# Patient Record
Sex: Male | Born: 1940
Health system: Southern US, Community
[De-identification: ages and names within clinical notes are randomized; demographics above are authoritative.]

## PROBLEM LIST (undated history)

## (undated) DIAGNOSIS — R5383 Other fatigue: Secondary | ICD-10-CM

## (undated) DIAGNOSIS — R630 Anorexia: Secondary | ICD-10-CM

## (undated) DIAGNOSIS — M79604 Pain in right leg: Secondary | ICD-10-CM

## (undated) DIAGNOSIS — M7989 Other specified soft tissue disorders: Secondary | ICD-10-CM

## (undated) HISTORY — DX: Other specified soft tissue disorders: M79.89

## (undated) HISTORY — DX: Other fatigue: R53.83

## (undated) HISTORY — DX: Anorexia: R63.0

## (undated) HISTORY — DX: Pain in right leg: M79.604

---

## 2016-01-19 ENCOUNTER — Ambulatory Visit: Payer: Self-pay | Attending: Internal Medicine

## 2016-03-20 ENCOUNTER — Ambulatory Visit (INDEPENDENT_AMBULATORY_CARE_PROVIDER_SITE_OTHER): Payer: No Typology Code available for payment source | Admitting: Family Medicine

## 2016-03-20 ENCOUNTER — Encounter: Payer: Self-pay | Admitting: Family Medicine

## 2016-03-20 VITALS — BP 129/73 | HR 66 | Temp 97.7°F | Resp 16 | Ht 63.0 in | Wt 152.0 lb

## 2016-03-20 DIAGNOSIS — Z7689 Persons encountering health services in other specified circumstances: Secondary | ICD-10-CM

## 2016-03-20 DIAGNOSIS — Z0000 Encounter for general adult medical examination without abnormal findings: Secondary | ICD-10-CM

## 2016-03-20 DIAGNOSIS — Z23 Encounter for immunization: Secondary | ICD-10-CM

## 2016-03-20 LAB — CBC WITH DIFFERENTIAL/PLATELET
BASOS PCT: 0 %
Basophils Absolute: 0 cells/uL (ref 0–200)
EOS PCT: 3 %
Eosinophils Absolute: 192 cells/uL (ref 15–500)
HCT: 49.2 % (ref 38.5–50.0)
Hemoglobin: 16.5 g/dL (ref 13.2–17.1)
LYMPHS PCT: 42 %
Lymphs Abs: 2688 cells/uL (ref 850–3900)
MCH: 29.9 pg (ref 27.0–33.0)
MCHC: 33.5 g/dL (ref 32.0–36.0)
MCV: 89.3 fL (ref 80.0–100.0)
MONOS PCT: 13 %
MPV: 12 fL (ref 7.5–12.5)
Monocytes Absolute: 832 cells/uL (ref 200–950)
NEUTROS ABS: 2688 {cells}/uL (ref 1500–7800)
Neutrophils Relative %: 42 %
PLATELETS: 170 10*3/uL (ref 140–400)
RBC: 5.51 MIL/uL (ref 4.20–5.80)
RDW: 14.7 % (ref 11.0–15.0)
WBC: 6.4 10*3/uL (ref 3.8–10.8)

## 2016-03-20 LAB — PSA: PSA: 1.7 ng/mL (ref <=4.0)

## 2016-03-20 LAB — HIV ANTIBODY (ROUTINE TESTING W REFLEX): HIV: NONREACTIVE

## 2016-03-20 NOTE — Patient Instructions (Signed)
Continue healthy diet and regular exercise Come back in 6 months Come back sooner if needed.

## 2016-03-20 NOTE — Progress Notes (Signed)
Leon Baird, is a 75 y.o. male  RUE:[id number]:[id number]  JYN:[id number]  DOB - Sep 26, 1940  CC:  Chief Complaint  Patient presents with  . Establish Care       HPI: Leon JulyKeshav Kilker is a 75 y.o. male here to establish care. He speaks little [place] and his daughter acts as Equities traderinterpreter. They reports that he has no chronic health problems and takes no regular medications.  He eats a health diet, high in vegetables, fruits and lean, non-fried meats. He walks 4-5 miles daily. He does not use tobacco, alcohol or drugs.   Health maintenance: He will accept a Tdap and flu shot today. Will also perfor HIV screening, Prostate and colon cancer screeing.   No Known Allergies History reviewed. No pertinent past medical history. No current outpatient prescriptions on file prior to visit.   No current facility-administered medications on file prior to visit.    History reviewed. No pertinent family history. Social History   Social History  . Marital status: Married    Spouse name: N/A  . Number of children: N/A  . Years of education: N/A   Occupational History  . Not on file.   Social History Main Topics  . Smoking status: Former Games developermoker  . Smokeless tobacco: Never Used  . Alcohol use No  . Drug use: No  . Sexual activity: Not on file   Other Topics Concern  . Not on file   Social History Narrative  . No narrative on file    Review of Systems: Constitutional: Negative Skin: Negative HENT: Negative. Positive for hearing aid Eyes: Negative  Neck: Negative Respiratory: Negative Cardiovascular: Negative Gastrointestinal: Negative Genitourinary: Negative  Musculoskeletal: Negative   Neurological: Negative  Hematological: Negative  Psychiatric/Behavioral: Negative    Objective:   Vitals:   03/20/16 0902  BP: 129/73  Pulse: 66  Resp: 16  Temp: 97.7 F (36.5 C)    Physical Exam: Constitutional: Patient appears well-developed and well-nourished. No distress. HENT:  Normocephalic, atraumatic, External right and left ear normal. Oropharynx is clear and moist.  Eyes: Conjunctivae and EOM are normal. PERRLA, no scleral icterus. Neck: Normal ROM. Neck supple. No lymphadenopathy, No thyromegaly. CVS: RRR, S1/S2 +, no murmurs, no gallops, no rubs Pulmonary: Effort and breath sounds normal, no stridor, rhonchi, wheezes, rales.  Abdominal: Soft. Normoactive BS,, no distension, tenderness, rebound or guarding.  Musculoskeletal: Normal range of motion. No edema and no tenderness.  Neuro: Alert.Normal muscle tone coordination. Non-focal Skin: Skin is warm and dry. No rash noted. Not diaphoretic. No erythema. No pallor. Psychiatric: Normal mood and affect. Behavior, judgment, thought content normal.  No results found for: WBC, HGB, HCT, MCV, PLT No results found for: CREATININE, BUN, NA, K, CL, CO2  No results found for: HGBA1C Lipid Panel  No results found for: CHOL, TRIG, HDL, CHOLHDL, VLDL, LDLCALC     Assessment and plan:   1. Healthcare maintenance  - Hemoglobin A1c - CBC with Differential - COMPLETE METABOLIC PANEL WITH GFR - Lipid panel - HIV antibody (with reflex) - PSA - POC Hemoccult Bld/Stl (3-Cd Home Screen); Future - Tdap vaccine greater than or equal to 7yo IM - Flu Vaccine QUAD 36+ mos PF IM (Fluarix & Fluzone Quad PF)  2. Encounter to establish care -I have reviewed information presented by the patient and his daughter.    Return in about 6 months (around 09/18/2016).  The patient was given clear instructions to go to ER or return to medical center if symptoms don't improve,  worsen or new problems develop. The patient verbalized understanding.    Henrietta Hoover FNP  03/20/2016, 11:06 AM

## 2016-03-21 ENCOUNTER — Other Ambulatory Visit: Payer: Self-pay | Admitting: Family Medicine

## 2016-03-21 LAB — COMPLETE METABOLIC PANEL WITH GFR
ALT: 19 U/L (ref 9–46)
AST: 23 U/L (ref 10–35)
Albumin: 4.4 g/dL (ref 3.6–5.1)
Alkaline Phosphatase: 44 U/L (ref 40–115)
BUN: 12 mg/dL (ref 7–25)
CHLORIDE: 99 mmol/L (ref 98–110)
CO2: 24 mmol/L (ref 20–31)
CREATININE: 1.31 mg/dL — AB (ref 0.70–1.18)
Calcium: 9.9 mg/dL (ref 8.6–10.3)
GFR, EST AFRICAN AMERICAN: 62 mL/min (ref >=60)
GFR, Est Non African American: 53 mL/min — ABNORMAL LOW (ref >=60)
GLUCOSE: 95 mg/dL (ref 65–99)
Potassium: 5.4 mmol/L — ABNORMAL HIGH (ref 3.5–5.3)
SODIUM: 136 mmol/L (ref 135–146)
Total Bilirubin: 1.4 mg/dL — ABNORMAL HIGH (ref 0.2–1.2)
Total Protein: 7.3 g/dL (ref 6.1–8.1)

## 2016-03-21 LAB — LIPID PANEL
Cholesterol: 247 mg/dL — ABNORMAL HIGH (ref 125–200)
HDL: 58 mg/dL (ref >=40)
LDL CALC: 130 mg/dL — AB (ref <130)
TRIGLYCERIDES: 295 mg/dL — AB (ref <150)
Total CHOL/HDL Ratio: 4.3 Ratio (ref <=5.0)
VLDL: 59 mg/dL — AB (ref <30)

## 2016-03-21 LAB — HEMOGLOBIN A1C
HEMOGLOBIN A1C: 5.5 % (ref <5.7)
MEAN PLASMA GLUCOSE: 111 mg/dL

## 2016-03-21 MED ORDER — SIMVASTATIN 20 MG PO TABS: 20.0000 mg | ORAL_TABLET | Freq: Every day | ORAL | 1 refills | Status: AC

## 2016-03-21 MED FILL — SIMVASTATIN 20 MG TABLET: 20 | 30 days supply | Qty: 30 | Fill #0

## 2016-03-22 ENCOUNTER — Other Ambulatory Visit: Payer: Self-pay | Admitting: Family Medicine

## 2016-09-25 ENCOUNTER — Ambulatory Visit: Payer: No Typology Code available for payment source | Admitting: Family Medicine

## 2016-11-06 ENCOUNTER — Ambulatory Visit: Payer: No Typology Code available for payment source | Admitting: Internal Medicine

## 2017-05-11 ENCOUNTER — Ambulatory Visit: Payer: Self-pay | Attending: Family Medicine

## 2017-05-18 ENCOUNTER — Other Ambulatory Visit: Payer: Self-pay | Admitting: Gastroenterology

## 2017-05-18 DIAGNOSIS — R1033 Periumbilical pain: Secondary | ICD-10-CM

## 2017-05-24 ENCOUNTER — Ambulatory Visit
Admission: RE | Admit: 2017-05-24 | Discharge: 2017-05-24 | Disposition: A | Discharge status: Home / Self Care | Payer: No Typology Code available for payment source | Source: Ambulatory Visit | Attending: Gastroenterology | Admitting: Gastroenterology

## 2017-05-24 DIAGNOSIS — R1033 Periumbilical pain: Secondary | ICD-10-CM

## 2017-05-24 MED ORDER — IOPAMIDOL (ISOVUE-300) INJECTION 61%
100.0000 mL | Freq: Once | INTRAVENOUS | Status: AC | PRN
  Administered 2017-05-24: 100 mL via INTRAVENOUS

## 2017-05-25 IMAGING — CT CT ABD-PELV W/ CM
1 of 3 series · 14 of 32 positions shown, 19 images · IV contrast (APPLIED)
Comparison: None.

CLINICAL DATA: Intermittent umbilical pain x3 weeks

EXAM:
CT ABDOMEN AND PELVIS WITH CONTRAST
TECHNIQUE: Multidetector CT imaging of the abdomen and pelvis was performed
using the standard protocol following bolus administration of
intravenous contrast.
CONTRAST:  100mL [8I] IOPAMIDOL ([8I]) INJECTION 61%

[Series 2: abd/pelvis w/cm · axial · 0.71mm/px · z∈[-411,+4]mm · 14 of 93 slices shown, 19 images]
[im 5/93  soft-tissue]
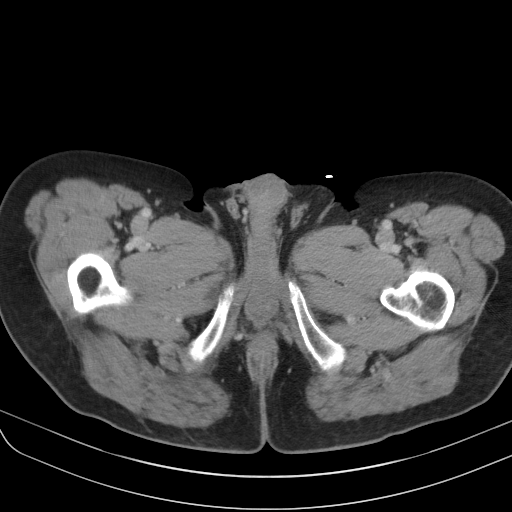
[im 5/93  bone]
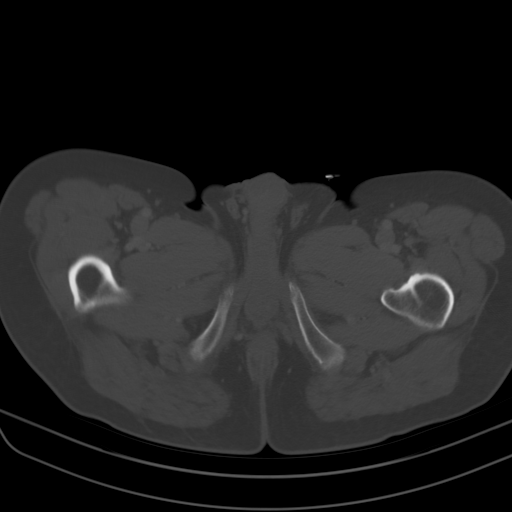
[im 14/93  soft-tissue]
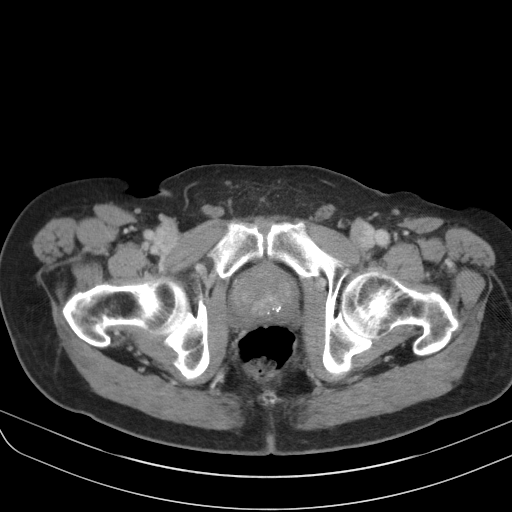
[im 19/93  soft-tissue]
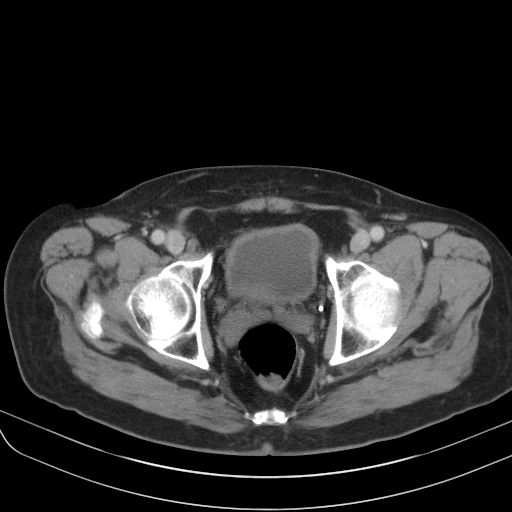
[im 28/93  soft-tissue]
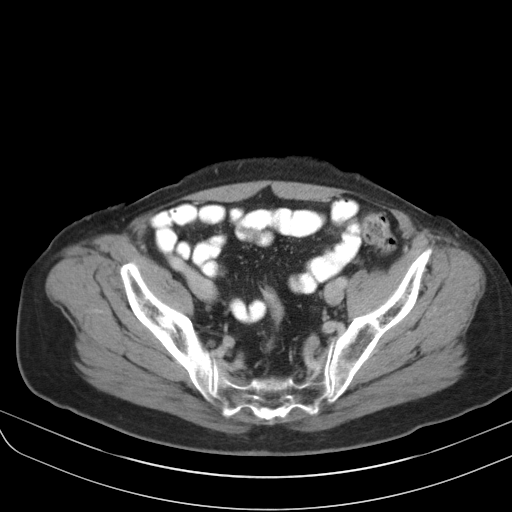
[im 33/93  soft-tissue]
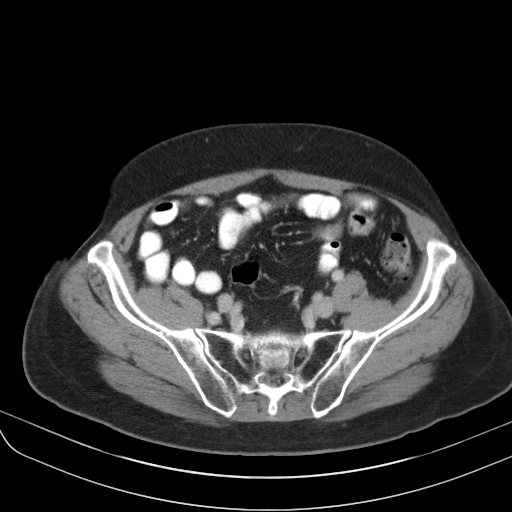
[im 42/93  soft-tissue]
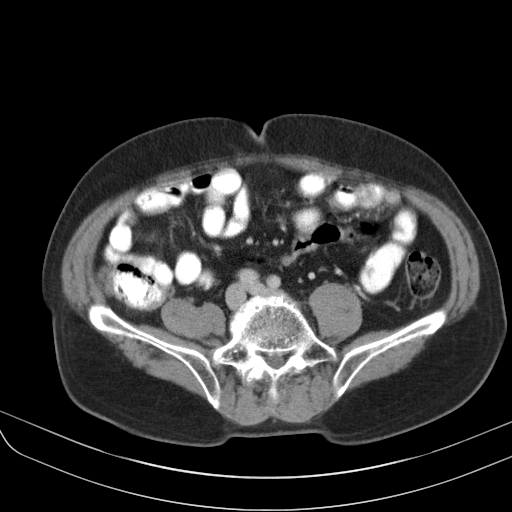
[im 47/93  soft-tissue]
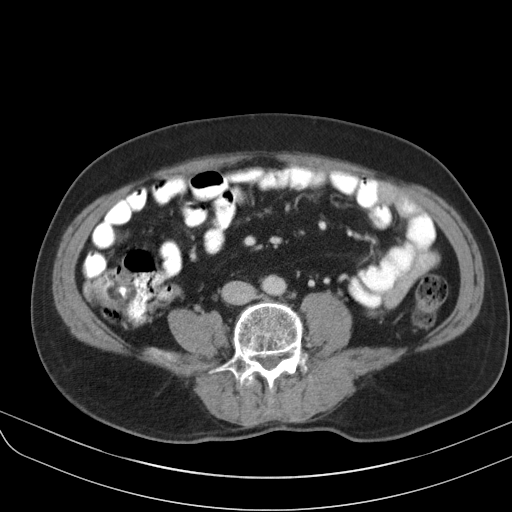
[im 51/93  soft-tissue]
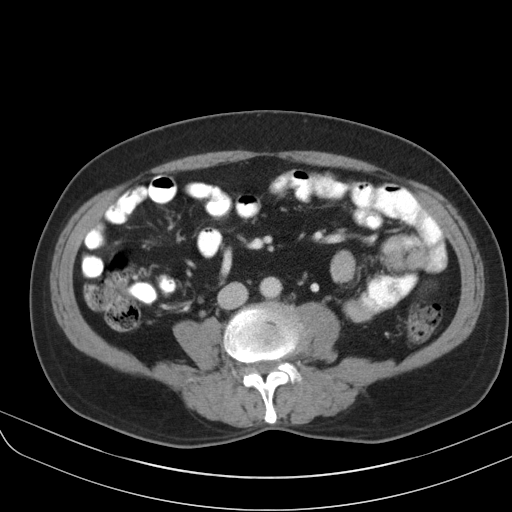
[im 60/93  soft-tissue]
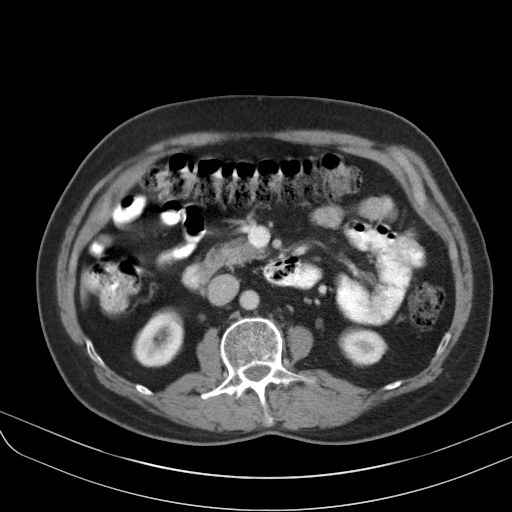
[im 60/93  bone]
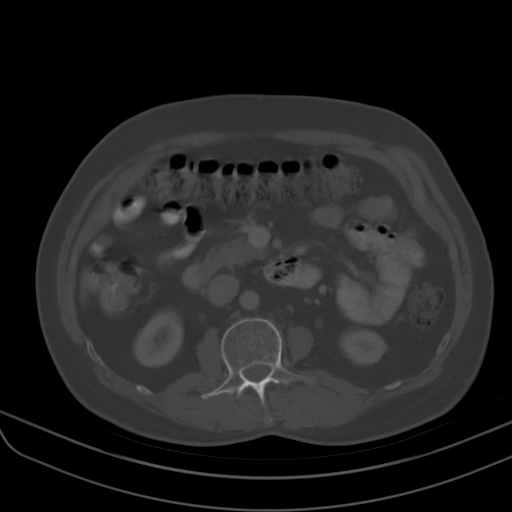
[im 65/93  soft-tissue]
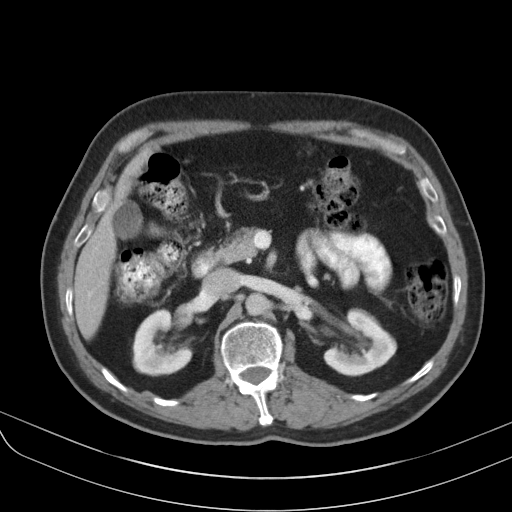
[im 74/93  soft-tissue]
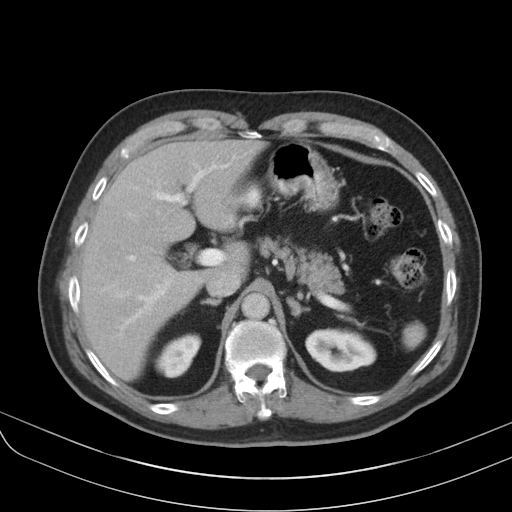
[im 74/93  lung]
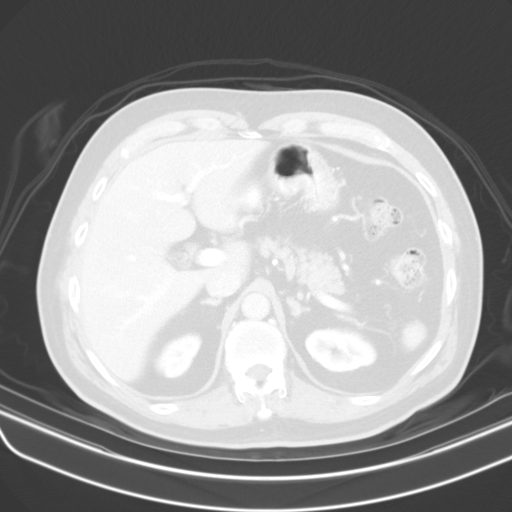
[im 79/93  soft-tissue]
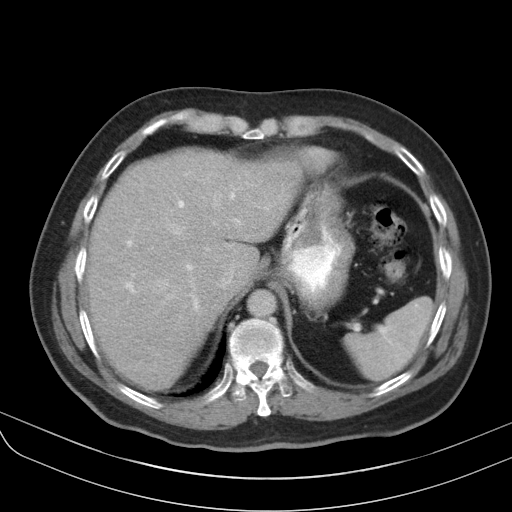
[im 79/93  lung]
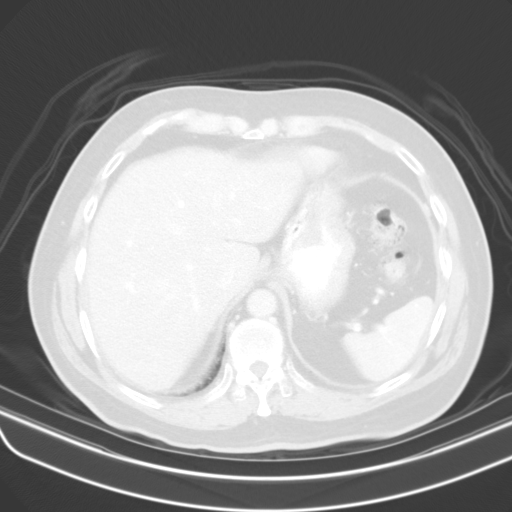
[im 83/93  lung]
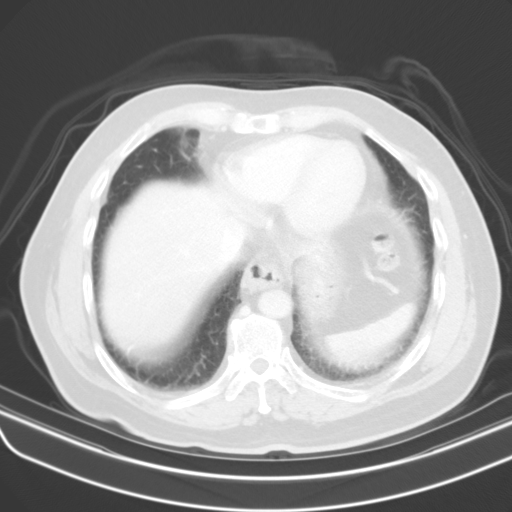
[im 88/93  soft-tissue]
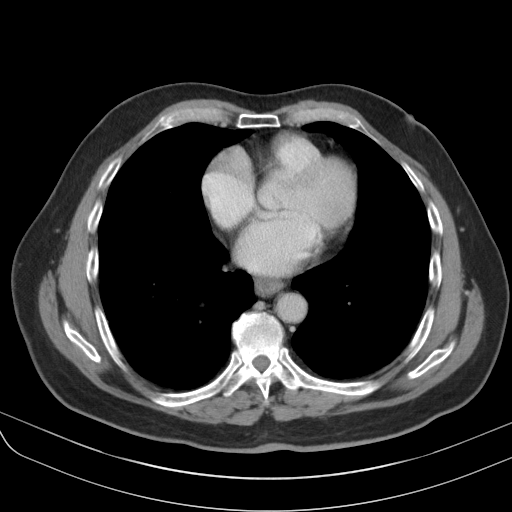
[im 88/93  lung]
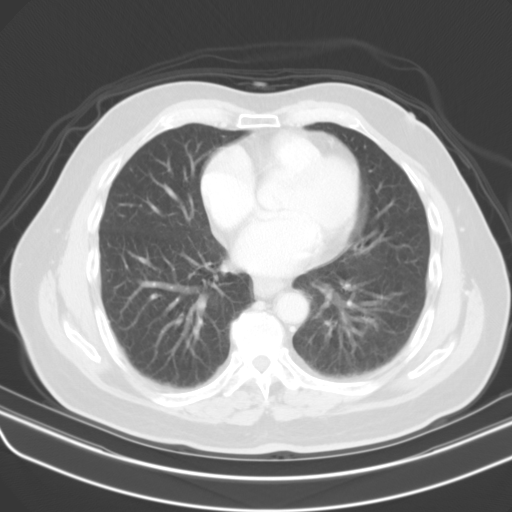

[14 of 32 positions shown; findings below may reference images not displayed]

FINDINGS: Motion degraded images.

Lower chest: Lung bases are clear.

Hepatobiliary: Liver is grossly unremarkable.

Gallbladder is unremarkable. No intrahepatic or extrahepatic ductal
dilatation.

Pancreas: Within normal limits.

Spleen: Calcified splenic granulomata.

Adrenals/Urinary Tract: Adrenal glands are within normal limits.

8 mm left upper pole renal cyst (series 2/image 19). Right kidney is
within normal limits. No hydronephrosis.

Bladder is mildly thick-walled although underdistended.

Stomach/Bowel: Stomach is notable for a small hiatal hernia.

No evidence of bowel obstruction.

Normal appendix (series 2/ image 54).

Vascular/Lymphatic: No evidence of abdominal aortic aneurysm.

No suspicious abdominopelvic lymphadenopathy.

Reproductive: Prostatomegaly, with enlargement of the central gland,
which indents the base of the bladder.

Other: No abdominopelvic ascites.

Musculoskeletal: Degenerative changes of the visualized
thoracolumbar spine.
IMPRESSION: Motion degraded images.

No evidence of bowel obstruction.  Normal appendix.

No CT findings to account for the patient's intermittent abdominal
pain.

Prostatomegaly, suggesting BPH. Mildly thick-walled bladder,
suggesting chronic bladder outlet obstruction.

## 2022-03-30 ENCOUNTER — Other Ambulatory Visit (HOSPITAL_BASED_OUTPATIENT_CLINIC_OR_DEPARTMENT_OTHER): Payer: Self-pay

## 2022-03-30 ENCOUNTER — Emergency Department (HOSPITAL_BASED_OUTPATIENT_CLINIC_OR_DEPARTMENT_OTHER): Payer: No Typology Code available for payment source

## 2022-03-30 ENCOUNTER — Other Ambulatory Visit: Payer: Self-pay

## 2022-03-30 ENCOUNTER — Encounter (HOSPITAL_BASED_OUTPATIENT_CLINIC_OR_DEPARTMENT_OTHER): Payer: Self-pay

## 2022-03-30 ENCOUNTER — Emergency Department (HOSPITAL_BASED_OUTPATIENT_CLINIC_OR_DEPARTMENT_OTHER)
Admission: EM | Admit: 2022-03-30 | Discharge: 2022-03-30 | Disposition: A | Discharge status: Home / Self Care | Payer: No Typology Code available for payment source | Attending: Emergency Medicine | Admitting: Emergency Medicine

## 2022-03-30 DIAGNOSIS — M25462 Effusion, left knee: Secondary | ICD-10-CM | POA: Insufficient documentation

## 2022-03-30 DIAGNOSIS — R5383 Other fatigue: Secondary | ICD-10-CM | POA: Insufficient documentation

## 2022-03-30 DIAGNOSIS — M7989 Other specified soft tissue disorders: Secondary | ICD-10-CM | POA: Insufficient documentation

## 2022-03-30 DIAGNOSIS — R600 Localized edema: Secondary | ICD-10-CM | POA: Insufficient documentation

## 2022-03-30 DIAGNOSIS — M79605 Pain in left leg: Secondary | ICD-10-CM | POA: Insufficient documentation

## 2022-03-30 DIAGNOSIS — R7989 Other specified abnormal findings of blood chemistry: Secondary | ICD-10-CM | POA: Insufficient documentation

## 2022-03-30 DIAGNOSIS — Z20822 Contact with and (suspected) exposure to covid-19: Secondary | ICD-10-CM | POA: Insufficient documentation

## 2022-03-30 DIAGNOSIS — M79604 Pain in right leg: Secondary | ICD-10-CM | POA: Insufficient documentation

## 2022-03-30 LAB — BASIC METABOLIC PANEL
Anion gap: 12 (ref 5–15)
BUN: 13 mg/dL (ref 8–23)
CO2: 25 mmol/L (ref 22–32)
Calcium: 9.7 mg/dL (ref 8.9–10.3)
Chloride: 94 mmol/L — ABNORMAL LOW (ref 98–111)
Creatinine, Ser: 1.02 mg/dL (ref 0.61–1.24)
GFR, Estimated: >60 mL/min (ref >60)
Glucose, Bld: 110 mg/dL — ABNORMAL HIGH (ref 70–99)
Potassium: 4.3 mmol/L (ref 3.5–5.1)
Sodium: 131 mmol/L — ABNORMAL LOW (ref 135–145)

## 2022-03-30 LAB — CBC
HCT: 39.1 % (ref 39.0–52.0)
Hemoglobin: 13 g/dL (ref 13.0–17.0)
MCH: 29 pg (ref 26.0–34.0)
MCHC: 33.2 g/dL (ref 30.0–36.0)
MCV: 87.3 fL (ref 80.0–100.0)
Platelets: 361 10*3/uL (ref 150–400)
RBC: 4.48 MIL/uL (ref 4.22–5.81)
RDW: 14.2 % (ref 11.5–15.5)
WBC: 8 10*3/uL (ref 4.0–10.5)
nRBC: 0 % (ref 0.0–0.2)

## 2022-03-30 LAB — SARS CORONAVIRUS 2 BY RT PCR: SARS Coronavirus 2 by RT PCR: NEGATIVE

## 2022-03-30 LAB — BRAIN NATRIURETIC PEPTIDE: B Natriuretic Peptide: 107.9 pg/mL — ABNORMAL HIGH (ref 0.0–100.0)

## 2022-03-30 MED ORDER — KETOROLAC TROMETHAMINE 30 MG/ML IJ SOLN
30.0000 mg | Freq: Once | INTRAMUSCULAR | Status: AC
  Administered 2022-03-30: 30 mg via INTRAVENOUS
  Filled 2022-03-30: qty 1

## 2022-03-30 MED ORDER — ACETAMINOPHEN 325 MG PO TABS
650.0000 mg | ORAL_TABLET | Freq: Four times a day (QID) | ORAL | 0 refills | Status: AC | PRN
  Filled 2022-03-30: qty 100, 13d supply, fill #0

## 2022-03-30 MED ORDER — DEXAMETHASONE SODIUM PHOSPHATE 10 MG/ML IJ SOLN
10.0000 mg | Freq: Once | INTRAMUSCULAR | Status: AC
  Administered 2022-03-30: 10 mg via INTRAMUSCULAR
  Filled 2022-03-30: qty 1

## 2022-03-30 MED ORDER — PREDNISONE 10 MG PO TABS
40.0000 mg | ORAL_TABLET | Freq: Every day | ORAL | 0 refills | Status: AC
  Filled 2022-03-30: qty 20, 5d supply, fill #0

## 2022-03-30 MED ORDER — IBUPROFEN 600 MG PO TABS
600.0000 mg | ORAL_TABLET | Freq: Four times a day (QID) | ORAL | 0 refills | Status: AC | PRN
  Filled 2022-03-30: qty 30, 8d supply, fill #0

## 2022-03-30 NOTE — ED Triage Notes (Signed)
Patient here POV from Home with Family.   Endorses Bilateral Leg Pain and Swelling for 12 Days. States he typically ambulates a Mile/Daily but cannot recently due to pain. Pain is localized to Bilateral Knees and Bilateral Feet. Obvious Swelling to Right Knee.   No Known Recent Trauma. Bilateral Pulses to Lower Extremities Present and Strong.   NAD noted during Triage. A&Ox4. GCS 15. BIB Wheelchair

## 2022-03-30 NOTE — ED Provider Notes (Signed)
[facility] EMERGENCY DEPT Provider Note   CSN: [id number] Arrival date & time: 03/30/22  1308     History  Chief Complaint  Patient presents with   Leg Pain    Leon Baird is a 81 y.o. male presenting to ED with bilateral leg swelling, for about 2 weeks.  His son at the bedside helps translate (patient is [place]).  He reports the patient has had no appetite, fatigue, and bilateral leg pain and swelling for about 2 weeks.  It began of the left leg and he was favoring the right, and the right knee swelled up.  The patient is now unable to bear any weight on his legs due to pain.  He typically walks about a mile a day.  He is extremely healthy according to his son, had a large panel of blood tests and cholesterol levels checked when he went to [place] 4 months ago by his family doctor, was told that there was nothing wrong.  He does not take any medications except for Tylenol as needed at home.  There were no falls or trauma  HPI     Home Medications Prior to Admission medications   Medication Sig Start Date End Date Taking? Authorizing Provider  acetaminophen (TYLENOL) 325 MG tablet Take 2 tablets (650 mg total) by mouth every 6 (six) hours as needed for up to 30 doses for mild pain or moderate pain. 03/30/22  Yes Tyrae Alcoser, Carola Rhine, MD  ibuprofen (ADVIL) 600 MG tablet Take 1 tablet (600 mg total) by mouth every 6 (six) hours as needed for up to 30 doses for mild pain or moderate pain. 03/30/22  Yes Trinita Devlin, Carola Rhine, MD  predniSONE (DELTASONE) 10 MG tablet Take 4 tablets (40 mg total) by mouth daily with breakfast for 5 days. 03/31/22 04/05/22 Yes Shiniqua Groseclose, Carola Rhine, MD  simvastatin (ZOCOR) 20 MG tablet Take 1 tablet (20 mg total) by mouth at bedtime. 03/21/16   Micheline Chapman, NP      Allergies    Patient has no known allergies.    Review of Systems   Review of Systems  Physical Exam Updated Vital Signs BP 121/75 (BP Location: Left Arm)   Pulse 69   Temp 98.4  F (36.9 C) (Oral)   Resp 19   Ht 5\' 3"  (1.6 m)   Wt 68.9 kg   SpO2 97%   BMI 26.91 kg/m  Physical Exam Constitutional:      General: He is not in acute distress. HENT:     Head: Normocephalic and atraumatic.  Eyes:     Conjunctiva/sclera: Conjunctivae normal.     Pupils: Pupils are equal, round, and reactive to light.  Cardiovascular:     Rate and Rhythm: Normal rate and regular rhythm.  Pulmonary:     Effort: Pulmonary effort is normal. No respiratory distress.  Abdominal:     General: There is no distension.     Tenderness: There is no abdominal tenderness.  Musculoskeletal:     Comments: Mild to moderate effusion of the left knee, patient is able to actively and passively perform full range of motion testing of the bilateral hips and knees, small effusion of the left knee  Skin:    General: Skin is warm and dry.  Neurological:     General: No focal deficit present.     Mental Status: He is alert. Mental status is at baseline.  Psychiatric:        Mood and Affect: Mood normal.  Behavior: Behavior normal.     ED Results / Procedures / Treatments   Labs (all labs ordered are listed, but only abnormal results are displayed) Labs Reviewed  BASIC METABOLIC PANEL - Abnormal; Notable for the following components:      Result Value   Sodium 131 (*)    Chloride 94 (*)    Glucose, Bld 110 (*)    All other components within normal limits  BRAIN NATRIURETIC PEPTIDE - Abnormal; Notable for the following components:   B Natriuretic Peptide 107.9 (*)    All other components within normal limits  SARS CORONAVIRUS 2 BY RT PCR  CBC    EKG None  Radiology US Venous Img Lower Bilateral  Result Date: 03/30/2022 CLINICAL DATA:  Lower extremity pain and edema x2 weeks. EXAM: BILATERAL LOWER EXTREMITY VENOUS DOPPLER ULTRASOUND TECHNIQUE: Gray-scale sonography with graded compression, as well as color Doppler and duplex ultrasound were performed to evaluate the lower  extremity deep venous systems from the level of the common femoral vein and including the common femoral, femoral, profunda femoral, popliteal and calf veins including the posterior tibial, peroneal and gastrocnemius veins when visible. The superficial great saphenous vein was also interrogated. Spectral Doppler was utilized to evaluate flow at rest and with distal augmentation maneuvers in the common femoral, femoral and popliteal veins. COMPARISON:  CT AP, 05/25/2017. FINDINGS: RIGHT LOWER EXTREMITY VENOUS Normal compressibility of the RIGHT common femoral, superficial femoral, and popliteal veins, as well as the visualized calf veins. Visualized portions of profunda femoral vein and great saphenous vein unremarkable. No filling defects to suggest DVT on grayscale or color Doppler imaging. Doppler waveforms show normal direction of venous flow, normal respiratory plasticity and response to augmentation. OTHER No evidence of superficial thrombophlebitis or abnormal fluid collection. Limitations: none LEFT LOWER EXTREMITY VENOUS Normal compressibility of the LEFT common femoral, superficial femoral, and popliteal veins, as well as the visualized calf veins. Visualized portions of profunda femoral vein and great saphenous vein unremarkable. No filling defects to suggest DVT on grayscale or color Doppler imaging. Doppler waveforms show normal direction of venous flow, normal respiratory plasticity and response to augmentation. OTHER No evidence of superficial thrombophlebitis or abnormal fluid collection. Limitations: none IMPRESSION: No evidence of femoropopliteal DVT or superficial thrombophlebitis within either lower extremity. Michaelle Birks, MD [facility] Radiology Electronically Signed   By: Michaelle Birks M.D.   On: 03/30/2022 17:20    Procedures Procedures    Medications Ordered in ED Medications  dexamethasone (DECADRON) injection 10 mg (10 mg Intramuscular  Given 03/30/22 1545)  ketorolac (TORADOL) 30 MG/ML injection 30 mg (30 mg Intravenous Given 03/30/22 1543)    ED Course/ Medical Decision Making/ A&P                           Medical Decision Making Amount and/or Complexity of Data Reviewed Labs: ordered.  Risk OTC drugs. Prescription drug management.   Patient is here with leg swelling.  Generalized fatigue.  Small knee effusion.  Differential would include peripheral edema versus DVT versus arthritis versus gout versus pseudogout versus other.  This clinical presentation is not consistent with a septic joint.  Of low suspicion for septic joint do not believe he needs emergent arthrocentesis at this time.  There is no traumatic mechanism to suggest this is a fracture, there is no indication for x-ray imaging at this time.  DVT ultrasound was ordered and personally viewed interpreted,  notable for no acute DVT  I did check electrolytes and basic blood work, notable for NA 131 likely related to poor oral intake; covid negative; BNP elevated 107 mildly, WBC and hgb wnl  Patient's son at the bedside providing supplemental history.  With leg swelling and BNP elevation I think is reasonable to refer him to cardiology for an evaluation for heart failure.  I do not see evidence of hypoxia or pulmonary edema on exam to warrant medical admission at this time.  Patient is on is in agreement with referral plan.  We discussed giving the patient a shot of Decadron and starting him on a short course of Decadron as this may be pseudogout, as well as NSAIDs, which may help with his pain.  He verbalized agreement and understanding        Final Clinical Impression(s) / ED Diagnoses Final diagnoses:  Leg swelling  Other fatigue    Rx / DC Orders ED Discharge Orders          Ordered    predniSONE (DELTASONE) 10 MG tablet  Daily with breakfast        03/30/22 1753    Ambulatory referral to Cardiology       Comments: Leg swelling with  elevated BNP - evaluation for possible new onset CHF   03/30/22 1752    ibuprofen (ADVIL) 600 MG tablet  Every 6 hours PRN        03/30/22 1753    acetaminophen (TYLENOL) 325 MG tablet  Every 6 hours PRN        03/30/22 1753              Wyvonnia Dusky, MD 03/30/22 2039

## 2022-03-30 NOTE — ED Notes (Signed)
Patient verbalizes understanding of discharge instructions. Opportunity for questioning and answers were provided. Patient discharged from ED.  °

## 2022-03-30 NOTE — ED Notes (Signed)
US at bedside

## 2022-03-31 ENCOUNTER — Other Ambulatory Visit (HOSPITAL_BASED_OUTPATIENT_CLINIC_OR_DEPARTMENT_OTHER): Payer: Self-pay

## 2022-04-30 ENCOUNTER — Encounter: Payer: Self-pay | Admitting: Cardiovascular Disease

## 2022-04-30 NOTE — Progress Notes (Signed)
This encounter was created in error - please disregard.

## 2022-05-01 ENCOUNTER — Encounter: Payer: No Typology Code available for payment source | Admitting: Cardiovascular Disease
